# Patient Record
Sex: Male | Born: 1970
Health system: Southern US, Community
[De-identification: ages and names within clinical notes are randomized; demographics above are authoritative.]

---

## 2014-08-23 DIAGNOSIS — G43909 Migraine, unspecified, not intractable, without status migrainosus: Secondary | ICD-10-CM | POA: Insufficient documentation

## 2014-08-23 DIAGNOSIS — I10 Essential (primary) hypertension: Secondary | ICD-10-CM | POA: Insufficient documentation

## 2014-08-23 DIAGNOSIS — E790 Hyperuricemia without signs of inflammatory arthritis and tophaceous disease: Secondary | ICD-10-CM | POA: Insufficient documentation

## 2014-08-23 DIAGNOSIS — K219 Gastro-esophageal reflux disease without esophagitis: Secondary | ICD-10-CM | POA: Insufficient documentation

## 2016-08-16 DIAGNOSIS — E790 Hyperuricemia without signs of inflammatory arthritis and tophaceous disease: Secondary | ICD-10-CM | POA: Diagnosis not present

## 2016-08-16 DIAGNOSIS — I10 Essential (primary) hypertension: Secondary | ICD-10-CM | POA: Diagnosis not present

## 2016-08-16 DIAGNOSIS — Z79899 Other long term (current) drug therapy: Secondary | ICD-10-CM | POA: Diagnosis not present

## 2017-08-13 ENCOUNTER — Other Ambulatory Visit: Payer: Self-pay | Admitting: Surgery

## 2017-08-13 DIAGNOSIS — M25561 Pain in right knee: Secondary | ICD-10-CM

## 2017-08-13 DIAGNOSIS — M2351 Chronic instability of knee, right knee: Secondary | ICD-10-CM | POA: Diagnosis not present

## 2017-08-19 ENCOUNTER — Ambulatory Visit
Admission: RE | Admit: 2017-08-19 | Discharge: 2017-08-19 | Disposition: A | Discharge status: Home / Self Care | Payer: 59 | Source: Ambulatory Visit | Attending: Surgery | Admitting: Surgery

## 2017-08-19 DIAGNOSIS — M2351 Chronic instability of knee, right knee: Secondary | ICD-10-CM | POA: Insufficient documentation

## 2017-08-19 DIAGNOSIS — M25561 Pain in right knee: Secondary | ICD-10-CM | POA: Insufficient documentation

## 2017-08-26 DIAGNOSIS — E7800 Pure hypercholesterolemia, unspecified: Secondary | ICD-10-CM | POA: Insufficient documentation

## 2017-09-10 DIAGNOSIS — M25562 Pain in left knee: Secondary | ICD-10-CM | POA: Diagnosis not present

## 2017-09-10 DIAGNOSIS — M25561 Pain in right knee: Secondary | ICD-10-CM | POA: Diagnosis not present

## 2017-11-25 DIAGNOSIS — R7302 Impaired glucose tolerance (oral): Secondary | ICD-10-CM | POA: Insufficient documentation

## 2018-03-26 DIAGNOSIS — R7301 Impaired fasting glucose: Secondary | ICD-10-CM | POA: Diagnosis not present

## 2018-03-26 DIAGNOSIS — E790 Hyperuricemia without signs of inflammatory arthritis and tophaceous disease: Secondary | ICD-10-CM | POA: Diagnosis not present

## 2018-03-26 DIAGNOSIS — I10 Essential (primary) hypertension: Secondary | ICD-10-CM | POA: Diagnosis not present

## 2018-03-26 DIAGNOSIS — E7800 Pure hypercholesterolemia, unspecified: Secondary | ICD-10-CM | POA: Diagnosis not present

## 2018-03-26 DIAGNOSIS — Z79899 Other long term (current) drug therapy: Secondary | ICD-10-CM | POA: Diagnosis not present

## 2018-04-07 DIAGNOSIS — Z79899 Other long term (current) drug therapy: Secondary | ICD-10-CM | POA: Diagnosis not present

## 2018-04-07 DIAGNOSIS — Z0000 Encounter for general adult medical examination without abnormal findings: Secondary | ICD-10-CM | POA: Diagnosis not present

## 2018-04-07 DIAGNOSIS — I10 Essential (primary) hypertension: Secondary | ICD-10-CM | POA: Diagnosis not present

## 2018-04-07 DIAGNOSIS — E790 Hyperuricemia without signs of inflammatory arthritis and tophaceous disease: Secondary | ICD-10-CM | POA: Diagnosis not present

## 2018-09-16 DIAGNOSIS — E7800 Pure hypercholesterolemia, unspecified: Secondary | ICD-10-CM | POA: Diagnosis not present

## 2018-09-16 DIAGNOSIS — R7301 Impaired fasting glucose: Secondary | ICD-10-CM | POA: Diagnosis not present

## 2018-10-06 DIAGNOSIS — M2351 Chronic instability of knee, right knee: Secondary | ICD-10-CM | POA: Diagnosis not present

## 2019-01-14 IMAGING — MR MR KNEE*R* W/O CM
6 series · 37 of 40 positions shown · non-contrast
Comparison: None.

CLINICAL DATA: Prior ACL tear 4 years ago. Instability. Knee pain.

EXAM:
MRI OF THE RIGHT KNEE WITHOUT CONTRAST
TECHNIQUE: Multiplanar, multisequence MR imaging of the knee was performed. No
intravenous contrast was administered.

[Series 3: PD fat-sat · axial · 3.0mm · 0.56mm/px · z∈[-85,+27]mm · 8 of 35 slices shown (1 of 4)]
[im 1/35]
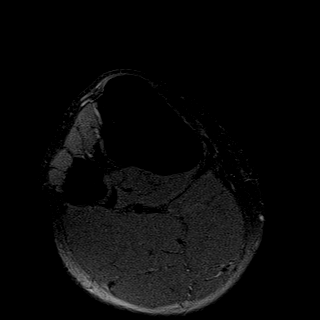
[im 5/35]
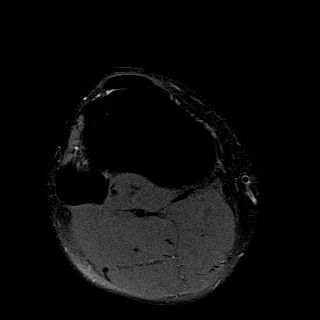
[im 10/35]
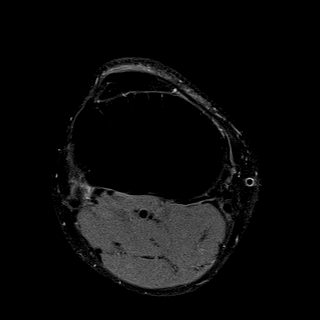
[im 15/35]
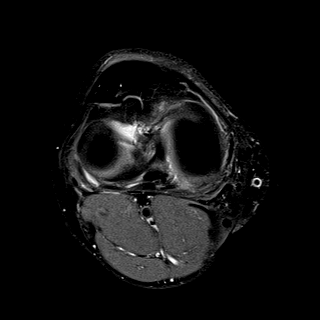
[im 20/35]
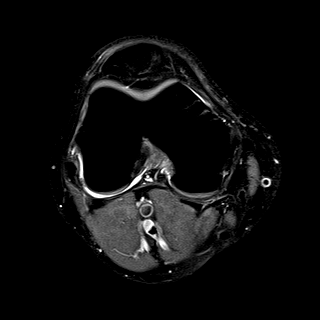
[im 25/35]
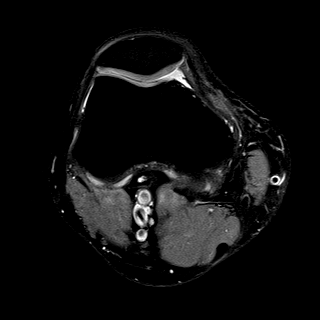
[im 30/35]
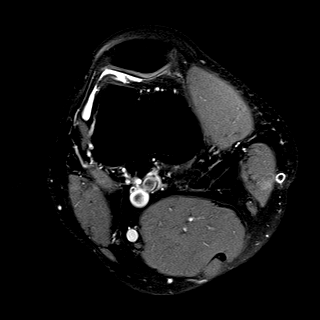
[im 35/35]
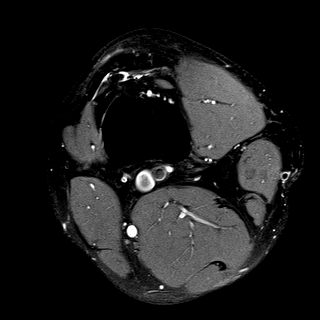

[Series 4: T1 · coronal · 3.0mm · 0.50mm/px · 4 of 33 slices shown]
[im 1/33]
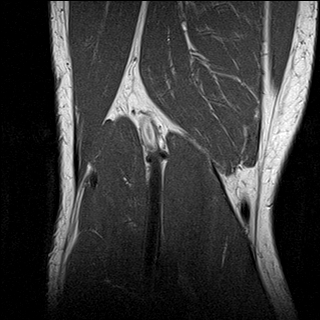
[im 6/33]
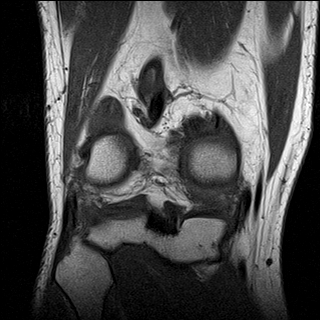
[im 11/33]
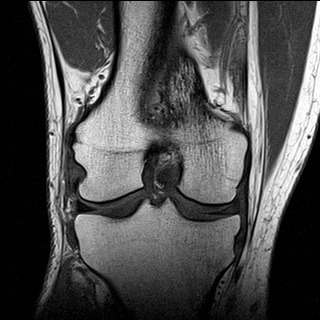
[im 17/33]
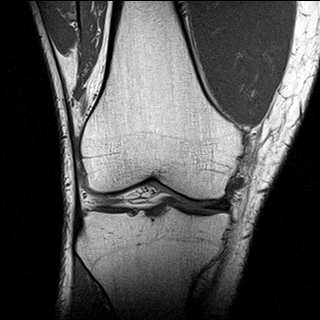

[Series 5: PD fat-sat · sagittal · 3.0mm · 0.50mm/px · 7 of 35 slices shown (2 of 4)]
[im 1/35]
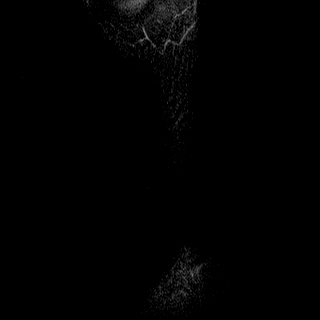
[im 6/35]
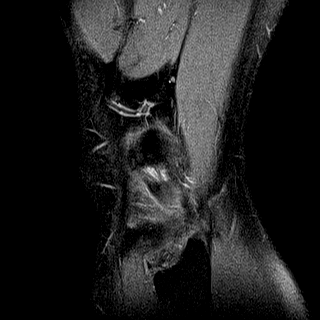
[im 12/35]
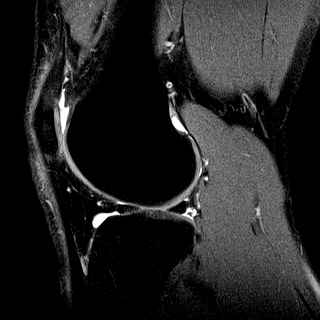
[im 18/35]
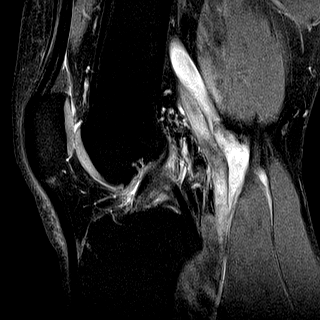
[im 23/35]
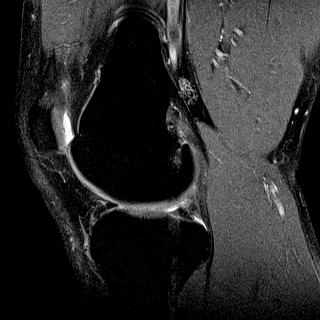
[im 29/35]
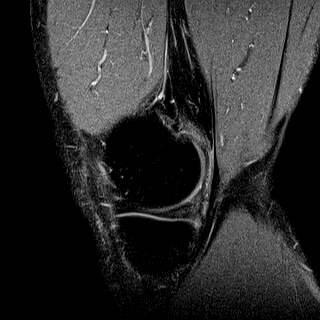
[im 35/35]
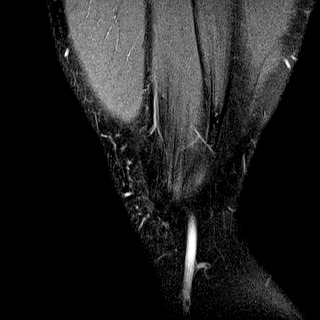

[Series 6: T2 fat-sat · coronal · 3.0mm · 0.31mm/px · 7 of 33 slices shown]
[im 1/33]
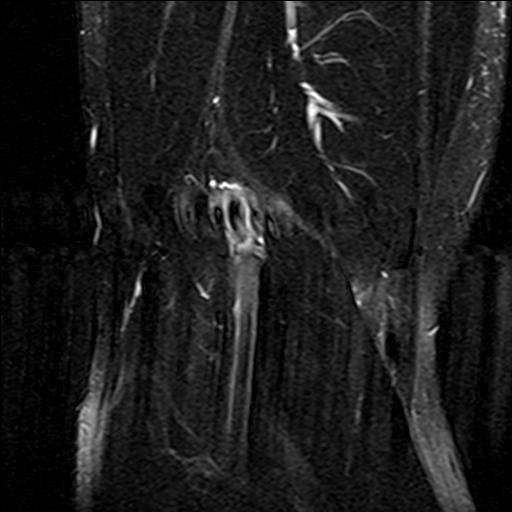
[im 6/33]
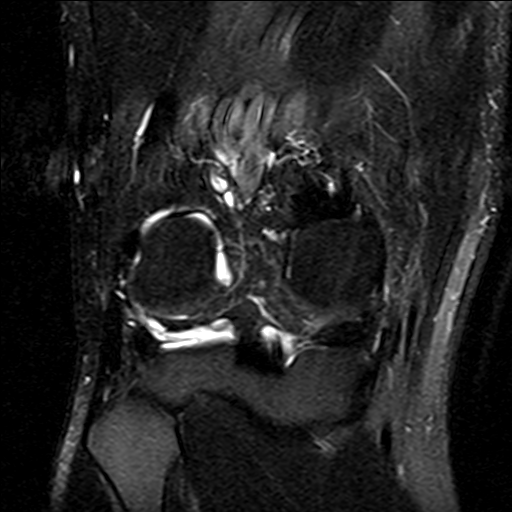
[im 11/33]
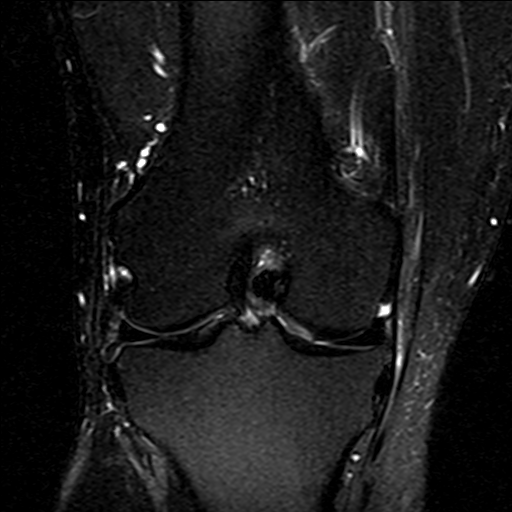
[im 17/33]
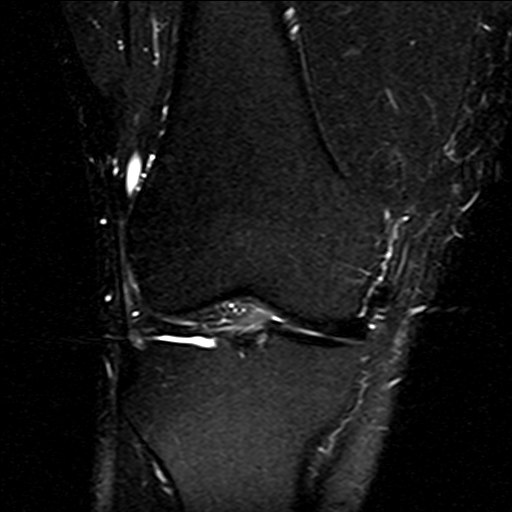
[im 22/33]
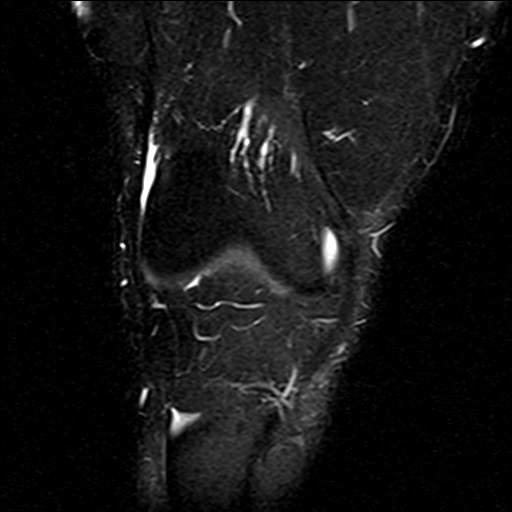
[im 27/33]
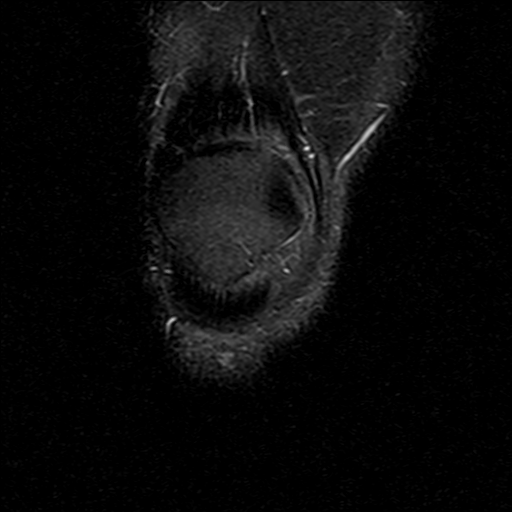
[im 33/33]
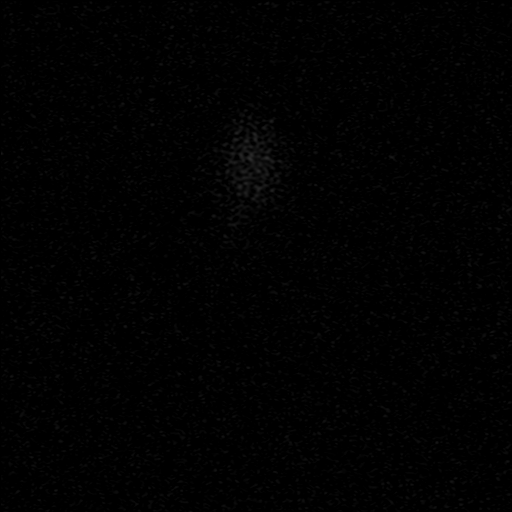

[Series 7: PD fat-sat · coronal · 3.0mm · 0.50mm/px · 7 of 33 slices shown (3 of 4)]
[im 1/33]
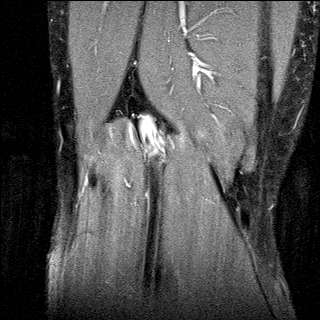
[im 6/33]
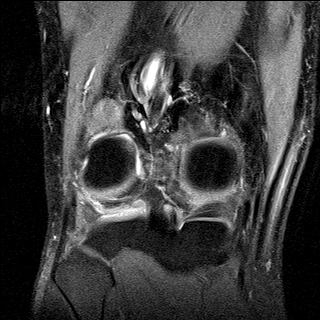
[im 11/33]
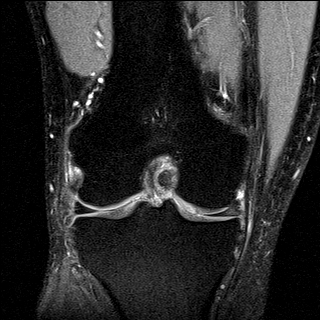
[im 17/33]
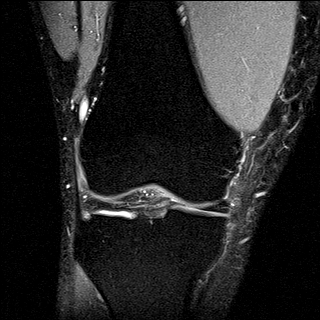
[im 22/33]
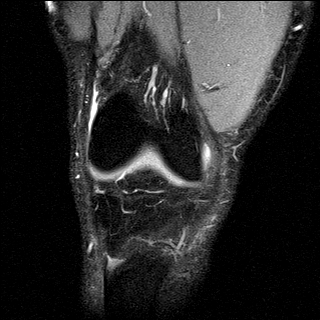
[im 27/33]
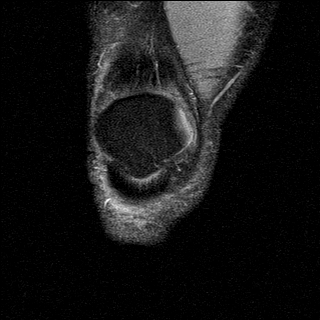
[im 33/33]
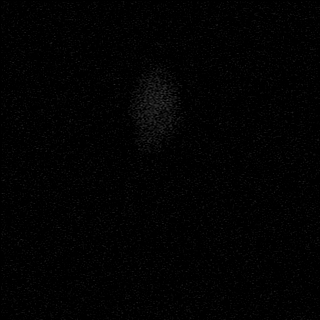

[Series 8: PD fat-sat · coronal · 2.0mm · 0.62mm/px · 4 of 21 slices shown (4 of 4)]
[im 1/21]
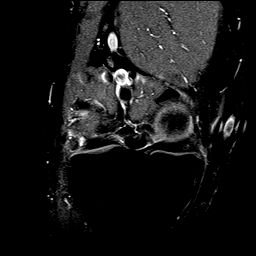
[im 7/21]
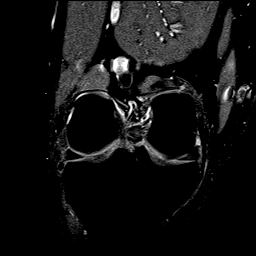
[im 14/21]
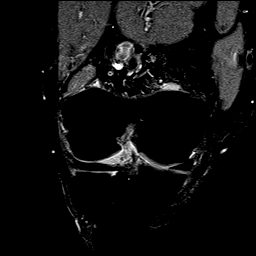
[im 21/21]
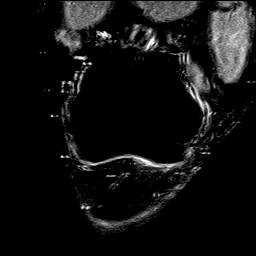

[37 of 40 positions shown; findings below may reference images not displayed]

FINDINGS: MENISCI

Medial meniscus:  Intact.

Lateral meniscus:  Intact.

LIGAMENTS

Cruciates:  Intact ACL and PCL.

Collaterals: Medial collateral ligament is intact. Lateral
collateral ligament complex is intact.

CARTILAGE

Patellofemoral:  No chondral defect.

Medial: Mild cartilage fissuring along the lateral aspect of the
medial femoral condyles.

Lateral:  No chondral defect.

Joint:  No joint effusion. Normal Hoffa's fat. No plical thickening.

Popliteal Fossa:  No Baker cyst. Intact popliteus tendon.

Extensor Mechanism: Intact quadriceps tendon and patellar tendon.
Intact medial and lateral patellar retinaculum. Intact MPFL.

Bones: No focal marrow signal abnormality. No fracture or
dislocation.

Other: No fluid collection or hematoma.
IMPRESSION: 1. No meniscal ligamentous injury of the right knee.
2. Mild cartilage fissuring along the lateral aspect of the medial
femoral condyles.

## 2019-03-10 MED FILL — LOSARTAN POTASSIUM 100 MG T: 100 | 30 days supply | Qty: 30 | Fill #0

## 2019-04-27 DIAGNOSIS — Z79899 Other long term (current) drug therapy: Secondary | ICD-10-CM | POA: Diagnosis not present

## 2019-04-27 DIAGNOSIS — R7302 Impaired glucose tolerance (oral): Secondary | ICD-10-CM | POA: Diagnosis not present

## 2019-04-27 DIAGNOSIS — R7989 Other specified abnormal findings of blood chemistry: Secondary | ICD-10-CM | POA: Diagnosis not present

## 2019-04-27 DIAGNOSIS — I10 Essential (primary) hypertension: Secondary | ICD-10-CM | POA: Diagnosis not present

## 2019-11-05 DIAGNOSIS — Z9189 Other specified personal risk factors, not elsewhere classified: Secondary | ICD-10-CM | POA: Diagnosis not present

## 2019-11-05 DIAGNOSIS — Z03818 Encounter for observation for suspected exposure to other biological agents ruled out: Secondary | ICD-10-CM | POA: Diagnosis not present

## 2020-09-05 ENCOUNTER — Other Ambulatory Visit: Payer: Self-pay | Admitting: Internal Medicine

## 2020-09-08 DIAGNOSIS — E7800 Pure hypercholesterolemia, unspecified: Secondary | ICD-10-CM | POA: Diagnosis not present

## 2020-09-08 DIAGNOSIS — R7989 Other specified abnormal findings of blood chemistry: Secondary | ICD-10-CM | POA: Diagnosis not present

## 2020-09-08 DIAGNOSIS — Z79899 Other long term (current) drug therapy: Secondary | ICD-10-CM | POA: Diagnosis not present

## 2020-09-08 DIAGNOSIS — R7302 Impaired glucose tolerance (oral): Secondary | ICD-10-CM | POA: Diagnosis not present

## 2020-09-08 DIAGNOSIS — E790 Hyperuricemia without signs of inflammatory arthritis and tophaceous disease: Secondary | ICD-10-CM | POA: Diagnosis not present

## 2020-09-08 DIAGNOSIS — Z125 Encounter for screening for malignant neoplasm of prostate: Secondary | ICD-10-CM | POA: Diagnosis not present

## 2020-11-03 DIAGNOSIS — Z23 Encounter for immunization: Secondary | ICD-10-CM | POA: Diagnosis not present

## 2021-03-20 ENCOUNTER — Other Ambulatory Visit: Payer: Self-pay

## 2021-03-20 MED FILL — Losartan Potassium Tab 100 MG: ORAL | 90 days supply | Qty: 90 | Fill #0 | Status: AC

## 2021-05-24 DIAGNOSIS — R059 Cough, unspecified: Secondary | ICD-10-CM | POA: Diagnosis not present

## 2021-05-24 DIAGNOSIS — R5383 Other fatigue: Secondary | ICD-10-CM | POA: Diagnosis not present

## 2021-05-24 DIAGNOSIS — U071 COVID-19: Secondary | ICD-10-CM | POA: Diagnosis not present

## 2021-05-24 DIAGNOSIS — R509 Fever, unspecified: Secondary | ICD-10-CM | POA: Diagnosis not present

## 2021-06-07 ENCOUNTER — Other Ambulatory Visit: Payer: Self-pay

## 2021-06-07 MED FILL — Losartan Potassium Tab 100 MG: ORAL | 90 days supply | Qty: 90 | Fill #1 | Status: AC

## 2021-07-13 ENCOUNTER — Other Ambulatory Visit: Payer: Self-pay

## 2021-07-13 DIAGNOSIS — I10 Essential (primary) hypertension: Secondary | ICD-10-CM | POA: Diagnosis not present

## 2021-07-13 DIAGNOSIS — I350 Nonrheumatic aortic (valve) stenosis: Secondary | ICD-10-CM | POA: Diagnosis not present

## 2021-07-13 MED ORDER — ROSUVASTATIN CALCIUM 5 MG PO TABS
ORAL_TABLET | ORAL | 11 refills | Status: AC
  Filled 2021-07-13: qty 30, 30d supply, fill #0
  Filled 2021-08-10: qty 30, 30d supply, fill #1
  Filled 2021-09-09: qty 30, 30d supply, fill #2

## 2021-08-10 ENCOUNTER — Other Ambulatory Visit: Payer: Self-pay

## 2021-08-21 DIAGNOSIS — R079 Chest pain, unspecified: Secondary | ICD-10-CM | POA: Diagnosis not present

## 2021-08-24 ENCOUNTER — Other Ambulatory Visit: Payer: Self-pay

## 2021-08-27 ENCOUNTER — Other Ambulatory Visit: Payer: Self-pay

## 2021-08-27 MED ORDER — LOSARTAN POTASSIUM 100 MG PO TABS
100.0000 mg | ORAL_TABLET | Freq: Every day | ORAL | 3 refills | Status: AC
  Filled 2021-08-27: qty 90, 90d supply, fill #0
  Filled 2022-01-09: qty 90, 90d supply, fill #1
  Filled 2022-03-28: qty 90, 90d supply, fill #2

## 2021-09-10 ENCOUNTER — Other Ambulatory Visit: Payer: Self-pay

## 2021-09-19 ENCOUNTER — Other Ambulatory Visit: Payer: Self-pay

## 2021-09-19 MED ORDER — COLCHICINE 0.6 MG PO TABS
ORAL_TABLET | ORAL | 1 refills | Status: AC
  Filled 2021-09-19: qty 10, 4d supply, fill #0

## 2021-09-25 DIAGNOSIS — E7800 Pure hypercholesterolemia, unspecified: Secondary | ICD-10-CM | POA: Diagnosis not present

## 2021-09-25 DIAGNOSIS — Z0000 Encounter for general adult medical examination without abnormal findings: Secondary | ICD-10-CM | POA: Diagnosis not present

## 2021-09-25 DIAGNOSIS — I10 Essential (primary) hypertension: Secondary | ICD-10-CM | POA: Diagnosis not present

## 2021-09-25 DIAGNOSIS — Z23 Encounter for immunization: Secondary | ICD-10-CM | POA: Diagnosis not present

## 2021-09-25 DIAGNOSIS — N1831 Chronic kidney disease, stage 3a: Secondary | ICD-10-CM | POA: Diagnosis not present

## 2021-09-25 DIAGNOSIS — N183 Chronic kidney disease, stage 3 unspecified: Secondary | ICD-10-CM | POA: Insufficient documentation

## 2021-10-05 ENCOUNTER — Other Ambulatory Visit: Payer: Self-pay

## 2021-10-05 MED ORDER — CEFDINIR 300 MG PO CAPS
ORAL_CAPSULE | ORAL | 0 refills | Status: AC
  Filled 2021-10-05: qty 20, 10d supply, fill #0

## 2021-10-05 MED ORDER — ROSUVASTATIN CALCIUM 5 MG PO TABS
ORAL_TABLET | ORAL | 3 refills | Status: AC
  Filled 2021-10-05: qty 90, 90d supply, fill #0
  Filled 2022-01-09: qty 90, 90d supply, fill #1
  Filled 2022-03-28: qty 90, 90d supply, fill #2

## 2021-11-12 DIAGNOSIS — E7800 Pure hypercholesterolemia, unspecified: Secondary | ICD-10-CM | POA: Diagnosis not present

## 2021-11-12 DIAGNOSIS — I10 Essential (primary) hypertension: Secondary | ICD-10-CM | POA: Diagnosis not present

## 2021-11-12 DIAGNOSIS — E790 Hyperuricemia without signs of inflammatory arthritis and tophaceous disease: Secondary | ICD-10-CM | POA: Diagnosis not present

## 2021-11-12 DIAGNOSIS — Z79899 Other long term (current) drug therapy: Secondary | ICD-10-CM | POA: Diagnosis not present

## 2021-11-12 DIAGNOSIS — N1831 Chronic kidney disease, stage 3a: Secondary | ICD-10-CM | POA: Diagnosis not present

## 2021-11-12 DIAGNOSIS — R7302 Impaired glucose tolerance (oral): Secondary | ICD-10-CM | POA: Diagnosis not present

## 2021-11-12 DIAGNOSIS — K219 Gastro-esophageal reflux disease without esophagitis: Secondary | ICD-10-CM | POA: Diagnosis not present

## 2021-11-13 ENCOUNTER — Ambulatory Visit: Payer: 59 | Admitting: Registered Nurse

## 2021-11-13 ENCOUNTER — Encounter
Admission: RE | Disposition: A | Discharge status: Home / Self Care | Payer: Self-pay | Source: Home / Self Care | Attending: Gastroenterology

## 2021-11-13 ENCOUNTER — Ambulatory Visit
Admission: RE | Admit: 2021-11-13 | Discharge: 2021-11-13 | Disposition: A | Discharge status: Home / Self Care | Payer: 59 | Attending: Gastroenterology | Admitting: Gastroenterology

## 2021-11-13 ENCOUNTER — Encounter: Payer: Self-pay | Admitting: Gastroenterology

## 2021-11-13 DIAGNOSIS — Z87891 Personal history of nicotine dependence: Secondary | ICD-10-CM | POA: Insufficient documentation

## 2021-11-13 DIAGNOSIS — D124 Benign neoplasm of descending colon: Secondary | ICD-10-CM | POA: Diagnosis not present

## 2021-11-13 DIAGNOSIS — Z79899 Other long term (current) drug therapy: Secondary | ICD-10-CM | POA: Diagnosis not present

## 2021-11-13 DIAGNOSIS — K5730 Diverticulosis of large intestine without perforation or abscess without bleeding: Secondary | ICD-10-CM | POA: Diagnosis not present

## 2021-11-13 DIAGNOSIS — D122 Benign neoplasm of ascending colon: Secondary | ICD-10-CM | POA: Insufficient documentation

## 2021-11-13 DIAGNOSIS — I10 Essential (primary) hypertension: Secondary | ICD-10-CM | POA: Diagnosis not present

## 2021-11-13 DIAGNOSIS — Z1211 Encounter for screening for malignant neoplasm of colon: Secondary | ICD-10-CM | POA: Insufficient documentation

## 2021-11-13 DIAGNOSIS — K635 Polyp of colon: Secondary | ICD-10-CM | POA: Diagnosis not present

## 2021-11-13 HISTORY — PX: COLONOSCOPY: SHX5424

## 2021-11-13 SURGERY — COLONOSCOPY
Anesthesia: General

## 2021-11-13 MED ORDER — SODIUM CHLORIDE 0.9 % IV SOLN: INTRAVENOUS | Status: DC

## 2021-11-13 MED ORDER — PROPOFOL 10 MG/ML IV BOLUS
INTRAVENOUS | Status: DC | PRN
  Administered 2021-11-13: 70 mg via INTRAVENOUS

## 2021-11-13 MED ORDER — LIDOCAINE HCL (CARDIAC) PF 100 MG/5ML IV SOSY
PREFILLED_SYRINGE | INTRAVENOUS | Status: DC | PRN
  Administered 2021-11-13: 60 mg via INTRAVENOUS

## 2021-11-13 MED ORDER — PROPOFOL 500 MG/50ML IV EMUL
INTRAVENOUS | Status: DC | PRN
  Administered 2021-11-13: 140 ug/kg/min via INTRAVENOUS

## 2021-11-13 NOTE — Op Note (Signed)
[facility] Gastroenterology Patient Name: Colton Williams Procedure Date: 11/13/2021 7:42 AM MRN: [id number] Account #: [id number] Date of Birth: 1971-04-18 Admit Type: Outpatient Age: 50 Room: [facility] ENDO ROOM 4 Gender: Male Note Status: Finalized Instrument Name: Jasper Riling [id number] Procedure:             Colonoscopy Indications:           Screening for colorectal malignant neoplasm Providers:             Lucilla Lame MD, MD Referring MD:          Juluis Rainier (Referring MD) Medicines:             Propofol per Anesthesia Complications:         No immediate complications. Procedure:             Pre-Anesthesia Assessment:                        - Prior to the procedure, a History and Physical was                         performed, and patient medications and allergies were                         reviewed. The patient's tolerance of previous                         anesthesia was also reviewed. The risks and benefits                         of the procedure and the sedation options and risks                         were discussed with the patient. All questions were                         answered, and informed consent was obtained. Prior                         Anticoagulants: The patient has taken no previous                         anticoagulant or antiplatelet agents. ASA Grade                         Assessment: II - A patient with mild systemic disease.                         After reviewing the risks and benefits, the patient                         was deemed in satisfactory condition to undergo the                         procedure.                        After obtaining informed consent, the colonoscope was  passed under direct vision. Throughout the procedure,                         the patient's blood pressure, pulse, and oxygen                         saturations were monitored continuously. The                          Colonoscope was introduced through the anus and                         advanced to the the cecum, identified by appendiceal                         orifice and ileocecal valve. The colonoscopy was                         performed without difficulty. The patient tolerated                         the procedure well. The quality of the bowel                         preparation was excellent. Findings:      The perianal and digital rectal examinations were normal.      A 6 mm polyp was found in the ascending colon. The polyp was sessile.       The polyp was removed with a cold snare. Resection and retrieval were       complete.      A 3 mm polyp was found in the descending colon. The polyp was sessile.       The polyp was removed with a cold snare. Resection and retrieval were       complete.      Multiple small-mouthed diverticula were found in the sigmoid colon. Impression:            - One 6 mm polyp in the ascending colon, removed with                         a cold snare. Resected and retrieved.                        - One 3 mm polyp in the descending colon, removed with                         a cold snare. Resected and retrieved.                        - Diverticulosis in the sigmoid colon. Recommendation:        - Discharge patient to home.                        - Resume previous diet.                        - Continue present medications.                        -  Await pathology results.                        - If the pathology report reveals adenomatous tissue,                         then repeat the colonoscopy for surveillance in 7                         years. Procedure Code(s):     --- Professional ---                        [id number], Colonoscopy, flexible; with removal of                         tumor(s), polyp(s), or other lesion(s) by snare                         technique Diagnosis Code(s):     --- Professional ---                        Z12.11, Encounter for  screening for malignant neoplasm                         of colon                        K63.5, Polyp of colon CPT copyright 2019 [employer]. All rights reserved. The codes documented in this report are preliminary and upon coder review may  be revised to meet current compliance requirements. Lucilla Lame MD, MD 11/13/2021 8:04:35 AM This report has been signed electronically. Number of Addenda: 0 Note Initiated On: 11/13/2021 7:42 AM Scope Withdrawal Time: 0 hours 13 minutes 6 seconds  Total Procedure Duration: 0 hours 15 minutes 50 seconds  Estimated Blood Loss:  Estimated blood loss: none.      [facility]

## 2021-11-13 NOTE — H&P (Signed)
° °  Lucilla Lame, MD [facility] [address], [place], [place] [address] Phone: [phone] Fax : [phone]  Primary Care Physician:  Sallee Lange, NP Primary Gastroenterologist:  Dr. Allen Norris  Pre-Procedure History & Physical: HPI:  Colton Williams is a 50 y.o. male is here for a screening colonoscopy.   Past Medical History:  Diagnosis Date   Hypertension     History reviewed. No pertinent surgical history.  Prior to Admission medications   Medication Sig Start Date End Date Taking? Authorizing Provider  famotidine (PEPCID) 20 MG tablet Take 20 mg by mouth 2 (two) times daily.   Yes [provider]  losartan (COZAAR) 100 MG tablet TAKE 1 TABLET BY MOUTH ONCE DAILY FOR BLOOD PRESSURE 08/27/21  Yes   rosuvastatin (CRESTOR) 5 MG tablet Take 1 tablet (5 mg total) by mouth once daily 07/13/21  Yes   cefdinir (OMNICEF) 300 MG capsule Take 1 capsule (300 mg total) by mouth every 12 (twelve) hours for 10 days Patient not taking: Reported on 11/13/2021 10/05/21     colchicine 0.6 MG tablet Take 2 tablets (1.2mg ) by mouth at first sign of gout flare followed by 1 tablet (0.6mg ) after 1 hour. (Max 1.8mg  within 1 hour) 09/19/21     rosuvastatin (CRESTOR) 5 MG tablet Take 1 tablet (5 mg total) by mouth once daily 10/05/21       Allergies as of 10/15/2021   (Not on File)    History reviewed. No pertinent family history.  Social History   Socioeconomic History   Marital status: Married    Spouse name: Not on file   Number of children: Not on file   Years of education: Not on file   Highest education level: Not on file  Occupational History   Not on file  Tobacco Use   Smoking status: Former    Types: Cigarettes   Smokeless tobacco: Never  Vaping Use   Vaping Use: Never used  Substance and Sexual Activity   Alcohol use: Not Currently   Drug use: Not Currently   Sexual activity: Not on file  Other Topics Concern   Not on file  Social History Narrative    Not on file   Social Determinants of Health   Financial Resource Strain: Not on file  Food Insecurity: Not on file  Transportation Needs: Not on file  Physical Activity: Not on file  Stress: Not on file  Social Connections: Not on file  Intimate Partner Violence: Not on file    Review of Systems: See HPI, otherwise negative ROS  Physical Exam: BP (!) 149/87    Pulse 60    Temp (!) 96.1 F (35.6 C) (Temporal)    Resp 16    Ht 5\' 10"  (1.778 m)    Wt 96.2 kg    SpO2 99%    BMI 30.42 kg/m  General:   Alert,  pleasant and cooperative in NAD Head:  Normocephalic and atraumatic. Neck:  Supple; no masses or thyromegaly. Lungs:  Clear throughout to auscultation.    Heart:  Regular rate and rhythm. Abdomen:  Soft, nontender and nondistended. Normal bowel sounds, without guarding, and without rebound.   Neurologic:  Alert and  oriented x4;  grossly normal neurologically.  Impression/Plan: Colton Williams is now here to undergo a screening colonoscopy.  Risks, benefits, and alternatives regarding colonoscopy have been reviewed with the patient.  Questions have been answered.  All parties agreeable.

## 2021-11-13 NOTE — Transfer of Care (Signed)
Immediate Anesthesia Transfer of Care Note  Patient: Colton Williams  Procedure(s) Performed: COLONOSCOPY  Patient Location: PACU  Anesthesia Type:General  Level of Consciousness: awake, alert  and oriented  Airway & Oxygen Therapy: Patient Spontanous Breathing  Post-op Assessment: Report given to RN and Post -op Vital signs reviewed and stable  Post vital signs: Reviewed and stable  Last Vitals:  Vitals Value Taken Time  BP 109/72 11/13/21 0806  Temp    Pulse 65 11/13/21 0806  Resp 18 11/13/21 0806  SpO2 96 % 11/13/21 0806  Vitals shown include unvalidated device data.  Last Pain:  Vitals:   11/13/21 0714  TempSrc: Temporal         Complications: No notable events documented.

## 2021-11-13 NOTE — Anesthesia Postprocedure Evaluation (Signed)
Anesthesia Post Note  Patient: Colton Williams  Procedure(s) Performed: COLONOSCOPY  Patient location during evaluation: PACU Anesthesia Type: General Level of consciousness: awake and awake and alert Pain management: pain level controlled Vital Signs Assessment: post-procedure vital signs reviewed and stable Respiratory status: spontaneous breathing and respiratory function stable Cardiovascular status: blood pressure returned to baseline Anesthetic complications: no   No notable events documented.   Last Vitals:  Vitals:   11/13/21 0714 11/13/21 0806  BP: (!) 149/87   Pulse: 60   Resp: 16   Temp: (!) 35.6 C (!) 35.9 C  SpO2: 99%     Last Pain:  Vitals:   11/13/21 0816  TempSrc:   PainSc: 0-No pain                 VAN STAVEREN,Jumana Paccione

## 2021-11-13 NOTE — Anesthesia Preprocedure Evaluation (Signed)
Anesthesia Evaluation  Patient identified by MRN, date of birth, ID band Patient awake    Reviewed: Allergy & Precautions, NPO status , Patient's Chart, lab work & pertinent test results  Airway Mallampati: II  TM Distance: >3 FB Neck ROM: full    Dental  (+) Teeth Intact   Pulmonary neg pulmonary ROS, former smoker,    Pulmonary exam normal breath sounds clear to auscultation       Cardiovascular Exercise Tolerance: Good hypertension, Pt. on medications negative cardio ROS Normal cardiovascular exam Rhythm:Regular Rate:Normal     Neuro/Psych negative neurological ROS  negative psych ROS   GI/Hepatic negative GI ROS, Neg liver ROS,   Endo/Other  negative endocrine ROS  Renal/GU negative Renal ROS  negative genitourinary   Musculoskeletal negative musculoskeletal ROS (+)   Abdominal   Peds negative pediatric ROS (+)  Hematology negative hematology ROS (+)   Anesthesia Other Findings Past Medical History: No date: Hypertension  History reviewed. No pertinent surgical history.  BMI    Body Mass Index: 30.42 kg/m      Reproductive/Obstetrics negative OB ROS                             Anesthesia Physical Anesthesia Plan  ASA: 2  Anesthesia Plan: General   Post-op Pain Management:    Induction: Intravenous  PONV Risk Score and Plan: Propofol infusion and TIVA  Airway Management Planned: Natural Airway and Nasal Cannula  Additional Equipment:   Intra-op Plan:   Post-operative Plan:   Informed Consent: I have reviewed the patients History and Physical, chart, labs and discussed the procedure including the risks, benefits and alternatives for the proposed anesthesia with the patient or authorized representative who has indicated his/her understanding and acceptance.     Dental Advisory Given  Plan Discussed with: CRNA and Surgeon  Anesthesia Plan Comments:          Anesthesia Quick Evaluation

## 2021-11-14 ENCOUNTER — Encounter: Payer: Self-pay | Admitting: Gastroenterology

## 2021-11-14 LAB — SURGICAL PATHOLOGY

## 2022-01-09 ENCOUNTER — Other Ambulatory Visit: Payer: Self-pay

## 2022-01-18 DIAGNOSIS — I350 Nonrheumatic aortic (valve) stenosis: Secondary | ICD-10-CM | POA: Diagnosis not present

## 2022-02-22 DIAGNOSIS — I350 Nonrheumatic aortic (valve) stenosis: Secondary | ICD-10-CM | POA: Diagnosis not present

## 2022-02-22 DIAGNOSIS — Q231 Congenital insufficiency of aortic valve: Secondary | ICD-10-CM | POA: Diagnosis not present

## 2022-02-22 DIAGNOSIS — I77810 Thoracic aortic ectasia: Secondary | ICD-10-CM | POA: Diagnosis not present

## 2022-03-04 DIAGNOSIS — I10 Essential (primary) hypertension: Secondary | ICD-10-CM | POA: Diagnosis not present

## 2022-03-04 DIAGNOSIS — Q231 Congenital insufficiency of aortic valve: Secondary | ICD-10-CM | POA: Diagnosis not present

## 2022-03-04 DIAGNOSIS — R011 Cardiac murmur, unspecified: Secondary | ICD-10-CM | POA: Diagnosis not present

## 2022-03-04 DIAGNOSIS — I350 Nonrheumatic aortic (valve) stenosis: Secondary | ICD-10-CM | POA: Diagnosis not present

## 2022-03-21 DIAGNOSIS — Z131 Encounter for screening for diabetes mellitus: Secondary | ICD-10-CM | POA: Diagnosis not present

## 2022-03-21 DIAGNOSIS — E7800 Pure hypercholesterolemia, unspecified: Secondary | ICD-10-CM | POA: Diagnosis not present

## 2022-03-21 DIAGNOSIS — I10 Essential (primary) hypertension: Secondary | ICD-10-CM | POA: Diagnosis not present

## 2022-03-25 DIAGNOSIS — I350 Nonrheumatic aortic (valve) stenosis: Secondary | ICD-10-CM | POA: Diagnosis not present

## 2022-03-25 DIAGNOSIS — I129 Hypertensive chronic kidney disease with stage 1 through stage 4 chronic kidney disease, or unspecified chronic kidney disease: Secondary | ICD-10-CM | POA: Diagnosis not present

## 2022-03-25 DIAGNOSIS — N1831 Chronic kidney disease, stage 3a: Secondary | ICD-10-CM | POA: Diagnosis not present

## 2022-03-25 DIAGNOSIS — I77819 Aortic ectasia, unspecified site: Secondary | ICD-10-CM | POA: Diagnosis not present

## 2022-03-25 DIAGNOSIS — R0602 Shortness of breath: Secondary | ICD-10-CM | POA: Diagnosis not present

## 2022-03-25 DIAGNOSIS — R7303 Prediabetes: Secondary | ICD-10-CM | POA: Diagnosis not present

## 2022-03-25 DIAGNOSIS — Z87891 Personal history of nicotine dependence: Secondary | ICD-10-CM | POA: Diagnosis not present

## 2022-03-25 DIAGNOSIS — E785 Hyperlipidemia, unspecified: Secondary | ICD-10-CM | POA: Diagnosis not present

## 2022-03-28 ENCOUNTER — Other Ambulatory Visit: Payer: Self-pay

## 2022-05-27 DIAGNOSIS — I77810 Thoracic aortic ectasia: Secondary | ICD-10-CM | POA: Insufficient documentation

## 2022-05-27 DIAGNOSIS — I5032 Chronic diastolic (congestive) heart failure: Secondary | ICD-10-CM | POA: Insufficient documentation

## 2022-06-03 DIAGNOSIS — M109 Gout, unspecified: Secondary | ICD-10-CM | POA: Diagnosis not present

## 2022-06-03 DIAGNOSIS — I350 Nonrheumatic aortic (valve) stenosis: Secondary | ICD-10-CM | POA: Diagnosis not present

## 2022-06-03 DIAGNOSIS — Z01810 Encounter for preprocedural cardiovascular examination: Secondary | ICD-10-CM | POA: Diagnosis not present

## 2022-06-03 DIAGNOSIS — K219 Gastro-esophageal reflux disease without esophagitis: Secondary | ICD-10-CM | POA: Diagnosis not present

## 2022-06-03 DIAGNOSIS — I5032 Chronic diastolic (congestive) heart failure: Secondary | ICD-10-CM | POA: Diagnosis not present

## 2022-06-03 DIAGNOSIS — F419 Anxiety disorder, unspecified: Secondary | ICD-10-CM | POA: Diagnosis not present

## 2022-06-03 DIAGNOSIS — N183 Chronic kidney disease, stage 3 unspecified: Secondary | ICD-10-CM | POA: Diagnosis not present

## 2022-06-03 DIAGNOSIS — Q230 Congenital stenosis of aortic valve: Secondary | ICD-10-CM | POA: Diagnosis not present

## 2022-06-03 DIAGNOSIS — I2510 Atherosclerotic heart disease of native coronary artery without angina pectoris: Secondary | ICD-10-CM | POA: Diagnosis not present

## 2022-06-03 DIAGNOSIS — I130 Hypertensive heart and chronic kidney disease with heart failure and stage 1 through stage 4 chronic kidney disease, or unspecified chronic kidney disease: Secondary | ICD-10-CM | POA: Diagnosis not present

## 2022-06-03 DIAGNOSIS — N1831 Chronic kidney disease, stage 3a: Secondary | ICD-10-CM | POA: Diagnosis not present

## 2022-06-03 DIAGNOSIS — I77810 Thoracic aortic ectasia: Secondary | ICD-10-CM | POA: Diagnosis not present

## 2022-06-03 DIAGNOSIS — I959 Hypotension, unspecified: Secondary | ICD-10-CM | POA: Diagnosis not present

## 2022-06-03 DIAGNOSIS — E7800 Pure hypercholesterolemia, unspecified: Secondary | ICD-10-CM | POA: Diagnosis not present

## 2022-06-03 DIAGNOSIS — J9811 Atelectasis: Secondary | ICD-10-CM | POA: Diagnosis not present

## 2022-06-03 DIAGNOSIS — R7303 Prediabetes: Secondary | ICD-10-CM | POA: Diagnosis not present

## 2022-06-03 DIAGNOSIS — I110 Hypertensive heart disease with heart failure: Secondary | ICD-10-CM | POA: Diagnosis not present

## 2022-06-03 DIAGNOSIS — I492 Junctional premature depolarization: Secondary | ICD-10-CM | POA: Diagnosis not present

## 2022-06-03 DIAGNOSIS — E785 Hyperlipidemia, unspecified: Secondary | ICD-10-CM | POA: Diagnosis not present

## 2022-06-03 DIAGNOSIS — Z79899 Other long term (current) drug therapy: Secondary | ICD-10-CM | POA: Diagnosis not present

## 2022-06-03 DIAGNOSIS — I352 Nonrheumatic aortic (valve) stenosis with insufficiency: Secondary | ICD-10-CM | POA: Diagnosis not present

## 2022-06-03 DIAGNOSIS — N189 Chronic kidney disease, unspecified: Secondary | ICD-10-CM | POA: Diagnosis not present

## 2022-06-03 DIAGNOSIS — I5033 Acute on chronic diastolic (congestive) heart failure: Secondary | ICD-10-CM | POA: Diagnosis not present

## 2022-06-03 DIAGNOSIS — Z87891 Personal history of nicotine dependence: Secondary | ICD-10-CM | POA: Diagnosis not present

## 2022-06-03 DIAGNOSIS — R7302 Impaired glucose tolerance (oral): Secondary | ICD-10-CM | POA: Diagnosis not present

## 2022-06-03 DIAGNOSIS — I358 Other nonrheumatic aortic valve disorders: Secondary | ICD-10-CM | POA: Diagnosis not present

## 2022-06-03 DIAGNOSIS — Z4682 Encounter for fitting and adjustment of non-vascular catheter: Secondary | ICD-10-CM | POA: Diagnosis not present

## 2022-06-03 DIAGNOSIS — Q231 Congenital insufficiency of aortic valve: Secondary | ICD-10-CM | POA: Diagnosis not present

## 2022-06-03 DIAGNOSIS — I7121 Aneurysm of the ascending aorta, without rupture: Secondary | ICD-10-CM | POA: Insufficient documentation

## 2022-06-04 DIAGNOSIS — J9811 Atelectasis: Secondary | ICD-10-CM | POA: Diagnosis not present

## 2022-06-04 DIAGNOSIS — F419 Anxiety disorder, unspecified: Secondary | ICD-10-CM | POA: Diagnosis not present

## 2022-06-04 DIAGNOSIS — Q231 Congenital insufficiency of aortic valve: Secondary | ICD-10-CM | POA: Diagnosis not present

## 2022-06-04 DIAGNOSIS — Q230 Congenital stenosis of aortic valve: Secondary | ICD-10-CM | POA: Diagnosis not present

## 2022-06-04 DIAGNOSIS — Z4682 Encounter for fitting and adjustment of non-vascular catheter: Secondary | ICD-10-CM | POA: Diagnosis not present

## 2022-06-04 DIAGNOSIS — K219 Gastro-esophageal reflux disease without esophagitis: Secondary | ICD-10-CM | POA: Diagnosis not present

## 2022-06-04 DIAGNOSIS — N183 Chronic kidney disease, stage 3 unspecified: Secondary | ICD-10-CM | POA: Diagnosis not present

## 2022-06-04 DIAGNOSIS — G8918 Other acute postprocedural pain: Secondary | ICD-10-CM | POA: Insufficient documentation

## 2022-06-04 DIAGNOSIS — I358 Other nonrheumatic aortic valve disorders: Secondary | ICD-10-CM | POA: Diagnosis not present

## 2022-06-04 DIAGNOSIS — I352 Nonrheumatic aortic (valve) stenosis with insufficiency: Secondary | ICD-10-CM | POA: Diagnosis not present

## 2022-06-04 DIAGNOSIS — I110 Hypertensive heart disease with heart failure: Secondary | ICD-10-CM | POA: Diagnosis not present

## 2022-06-04 DIAGNOSIS — Z952 Presence of prosthetic heart valve: Secondary | ICD-10-CM | POA: Insufficient documentation

## 2022-06-04 DIAGNOSIS — I5033 Acute on chronic diastolic (congestive) heart failure: Secondary | ICD-10-CM | POA: Diagnosis not present

## 2022-06-05 DIAGNOSIS — E7800 Pure hypercholesterolemia, unspecified: Secondary | ICD-10-CM | POA: Diagnosis not present

## 2022-06-05 DIAGNOSIS — I5032 Chronic diastolic (congestive) heart failure: Secondary | ICD-10-CM | POA: Diagnosis not present

## 2022-06-05 DIAGNOSIS — N183 Chronic kidney disease, stage 3 unspecified: Secondary | ICD-10-CM | POA: Diagnosis not present

## 2022-06-05 DIAGNOSIS — N1831 Chronic kidney disease, stage 3a: Secondary | ICD-10-CM | POA: Diagnosis not present

## 2022-06-05 DIAGNOSIS — J948 Other specified pleural conditions: Secondary | ICD-10-CM | POA: Diagnosis not present

## 2022-06-05 DIAGNOSIS — R918 Other nonspecific abnormal finding of lung field: Secondary | ICD-10-CM | POA: Diagnosis not present

## 2022-06-05 DIAGNOSIS — I77810 Thoracic aortic ectasia: Secondary | ICD-10-CM | POA: Diagnosis not present

## 2022-06-05 DIAGNOSIS — I2510 Atherosclerotic heart disease of native coronary artery without angina pectoris: Secondary | ICD-10-CM | POA: Diagnosis not present

## 2022-06-05 DIAGNOSIS — J90 Pleural effusion, not elsewhere classified: Secondary | ICD-10-CM | POA: Diagnosis not present

## 2022-06-05 DIAGNOSIS — M109 Gout, unspecified: Secondary | ICD-10-CM | POA: Diagnosis not present

## 2022-06-05 DIAGNOSIS — I350 Nonrheumatic aortic (valve) stenosis: Secondary | ICD-10-CM | POA: Diagnosis not present

## 2022-06-05 DIAGNOSIS — I352 Nonrheumatic aortic (valve) stenosis with insufficiency: Secondary | ICD-10-CM | POA: Diagnosis not present

## 2022-06-05 DIAGNOSIS — I130 Hypertensive heart and chronic kidney disease with heart failure and stage 1 through stage 4 chronic kidney disease, or unspecified chronic kidney disease: Secondary | ICD-10-CM | POA: Diagnosis not present

## 2022-06-05 DIAGNOSIS — Z79899 Other long term (current) drug therapy: Secondary | ICD-10-CM | POA: Diagnosis not present

## 2022-06-05 DIAGNOSIS — Z952 Presence of prosthetic heart valve: Secondary | ICD-10-CM | POA: Diagnosis not present

## 2022-06-05 DIAGNOSIS — R0989 Other specified symptoms and signs involving the circulatory and respiratory systems: Secondary | ICD-10-CM | POA: Diagnosis not present

## 2022-06-05 DIAGNOSIS — I492 Junctional premature depolarization: Secondary | ICD-10-CM | POA: Diagnosis not present

## 2022-06-05 DIAGNOSIS — I959 Hypotension, unspecified: Secondary | ICD-10-CM | POA: Diagnosis not present

## 2022-06-05 DIAGNOSIS — R7302 Impaired glucose tolerance (oral): Secondary | ICD-10-CM | POA: Diagnosis not present

## 2022-06-06 DIAGNOSIS — Z952 Presence of prosthetic heart valve: Secondary | ICD-10-CM | POA: Diagnosis not present

## 2022-06-06 DIAGNOSIS — I352 Nonrheumatic aortic (valve) stenosis with insufficiency: Secondary | ICD-10-CM | POA: Diagnosis not present

## 2022-06-06 DIAGNOSIS — R918 Other nonspecific abnormal finding of lung field: Secondary | ICD-10-CM | POA: Diagnosis not present

## 2022-06-07 DIAGNOSIS — J948 Other specified pleural conditions: Secondary | ICD-10-CM | POA: Diagnosis not present

## 2022-06-07 DIAGNOSIS — I352 Nonrheumatic aortic (valve) stenosis with insufficiency: Secondary | ICD-10-CM | POA: Diagnosis not present

## 2022-06-07 DIAGNOSIS — I5032 Chronic diastolic (congestive) heart failure: Secondary | ICD-10-CM | POA: Diagnosis not present

## 2022-06-07 DIAGNOSIS — I77810 Thoracic aortic ectasia: Secondary | ICD-10-CM | POA: Diagnosis not present

## 2022-06-07 DIAGNOSIS — R918 Other nonspecific abnormal finding of lung field: Secondary | ICD-10-CM | POA: Diagnosis not present

## 2022-06-07 DIAGNOSIS — J90 Pleural effusion, not elsewhere classified: Secondary | ICD-10-CM | POA: Diagnosis not present

## 2022-06-08 DIAGNOSIS — R7302 Impaired glucose tolerance (oral): Secondary | ICD-10-CM | POA: Diagnosis not present

## 2022-06-08 DIAGNOSIS — N1831 Chronic kidney disease, stage 3a: Secondary | ICD-10-CM | POA: Diagnosis not present

## 2022-06-08 DIAGNOSIS — E7800 Pure hypercholesterolemia, unspecified: Secondary | ICD-10-CM | POA: Diagnosis not present

## 2022-06-08 DIAGNOSIS — I352 Nonrheumatic aortic (valve) stenosis with insufficiency: Secondary | ICD-10-CM | POA: Diagnosis not present

## 2022-06-08 DIAGNOSIS — I5032 Chronic diastolic (congestive) heart failure: Secondary | ICD-10-CM | POA: Diagnosis not present

## 2022-06-08 DIAGNOSIS — I350 Nonrheumatic aortic (valve) stenosis: Secondary | ICD-10-CM | POA: Diagnosis not present

## 2022-06-08 DIAGNOSIS — I77810 Thoracic aortic ectasia: Secondary | ICD-10-CM | POA: Diagnosis not present

## 2022-06-09 DIAGNOSIS — I352 Nonrheumatic aortic (valve) stenosis with insufficiency: Secondary | ICD-10-CM | POA: Diagnosis not present

## 2022-06-10 ENCOUNTER — Other Ambulatory Visit: Payer: Self-pay

## 2022-06-12 DIAGNOSIS — Z7901 Long term (current) use of anticoagulants: Secondary | ICD-10-CM | POA: Diagnosis not present

## 2022-06-13 ENCOUNTER — Other Ambulatory Visit: Payer: Self-pay | Admitting: *Deleted

## 2022-06-13 NOTE — Patient Outreach (Signed)
  Care Coordination [facility] Note Transition Care Management Unsuccessful Follow-up Telephone Call  Date of discharge and from where:  June 03 2022 from [facility] s/p AVR via thoracotomy Attempts:  1st Attempt  Reason for unsuccessful TCM follow-up call:  Left voice message  Joelene Millin L. Lavina Hamman, RN, BSN, [place] Coordinator Office number [phone] [facility] number [phone] Fax number [phone]

## 2022-06-17 DIAGNOSIS — Z7901 Long term (current) use of anticoagulants: Secondary | ICD-10-CM | POA: Diagnosis not present

## 2022-06-17 DIAGNOSIS — I350 Nonrheumatic aortic (valve) stenosis: Secondary | ICD-10-CM | POA: Diagnosis not present

## 2022-06-17 DIAGNOSIS — Z952 Presence of prosthetic heart valve: Secondary | ICD-10-CM | POA: Diagnosis not present

## 2022-06-17 DIAGNOSIS — Z4802 Encounter for removal of sutures: Secondary | ICD-10-CM | POA: Diagnosis not present

## 2022-06-17 DIAGNOSIS — Z09 Encounter for follow-up examination after completed treatment for conditions other than malignant neoplasm: Secondary | ICD-10-CM | POA: Diagnosis not present

## 2022-06-24 DIAGNOSIS — R9431 Abnormal electrocardiogram [ECG] [EKG]: Secondary | ICD-10-CM | POA: Diagnosis not present

## 2022-06-24 DIAGNOSIS — Z952 Presence of prosthetic heart valve: Secondary | ICD-10-CM | POA: Diagnosis not present

## 2022-06-24 DIAGNOSIS — Z7982 Long term (current) use of aspirin: Secondary | ICD-10-CM | POA: Diagnosis not present

## 2022-06-24 DIAGNOSIS — Z8679 Personal history of other diseases of the circulatory system: Secondary | ICD-10-CM | POA: Diagnosis not present

## 2022-06-24 DIAGNOSIS — Z79899 Other long term (current) drug therapy: Secondary | ICD-10-CM | POA: Diagnosis not present

## 2022-06-24 DIAGNOSIS — Z7901 Long term (current) use of anticoagulants: Secondary | ICD-10-CM | POA: Diagnosis not present

## 2022-06-24 DIAGNOSIS — Z48812 Encounter for surgical aftercare following surgery on the circulatory system: Secondary | ICD-10-CM | POA: Diagnosis not present

## 2022-06-24 DIAGNOSIS — J90 Pleural effusion, not elsewhere classified: Secondary | ICD-10-CM | POA: Diagnosis not present

## 2022-07-01 ENCOUNTER — Other Ambulatory Visit: Payer: Self-pay | Admitting: *Deleted

## 2022-07-01 ENCOUNTER — Encounter: Payer: Self-pay | Admitting: *Deleted

## 2022-07-02 ENCOUNTER — Ambulatory Visit: Payer: Self-pay | Admitting: *Deleted

## 2022-07-02 NOTE — Patient Outreach (Signed)
  Care Coordination [facility] Note Transition Care Management Unsuccessful Follow-up Telephone Call  Date of discharge and from where:  06/03/22 from [facility] [place] [facility]  Attempts:  2nd Attempt  Reason for unsuccessful TCM follow-up call:  Left voice message  Joelene Millin L. Lavina Hamman, RN, BSN, [place] Coordinator Office number [phone]

## 2022-07-08 ENCOUNTER — Ambulatory Visit: Payer: Self-pay | Admitting: *Deleted

## 2022-07-08 NOTE — Patient Outreach (Signed)
  Care Coordination   07/08/2022 Name: Colton Williams MRN: [id number] DOB: 1971-03-20   Care Coordination Outreach Attempts:  A third unsuccessful outreach was attempted today to offer the patient with information about available care coordination services as a benefit of their health plan.   Follow Up Plan:  No further outreach attempts will be made at this time. We have been unable to contact the patient to offer or enroll patient in care coordination services  Encounter Outcome:  No Answer  Care Coordination Interventions Activated:  No   Care Coordination Interventions:  No, not indicated    SIG Maday Guarino L. Lavina Hamman, RN, BSN, [place] Coordinator Office number [phone]

## 2022-07-09 DIAGNOSIS — Z7901 Long term (current) use of anticoagulants: Secondary | ICD-10-CM | POA: Diagnosis not present

## 2022-07-24 DIAGNOSIS — I5032 Chronic diastolic (congestive) heart failure: Secondary | ICD-10-CM | POA: Diagnosis not present

## 2022-07-24 DIAGNOSIS — I77810 Thoracic aortic ectasia: Secondary | ICD-10-CM | POA: Diagnosis not present

## 2022-07-24 DIAGNOSIS — Q231 Congenital insufficiency of aortic valve: Secondary | ICD-10-CM | POA: Diagnosis not present

## 2022-07-24 DIAGNOSIS — Z952 Presence of prosthetic heart valve: Secondary | ICD-10-CM | POA: Diagnosis not present

## 2022-07-24 DIAGNOSIS — I440 Atrioventricular block, first degree: Secondary | ICD-10-CM | POA: Diagnosis not present

## 2022-08-13 DIAGNOSIS — Z7901 Long term (current) use of anticoagulants: Secondary | ICD-10-CM | POA: Diagnosis not present

## 2022-09-04 DIAGNOSIS — Z7901 Long term (current) use of anticoagulants: Secondary | ICD-10-CM | POA: Diagnosis not present

## 2022-10-10 DIAGNOSIS — Z7901 Long term (current) use of anticoagulants: Secondary | ICD-10-CM | POA: Diagnosis not present

## 2022-10-24 DIAGNOSIS — Z0000 Encounter for general adult medical examination without abnormal findings: Secondary | ICD-10-CM | POA: Diagnosis not present

## 2022-10-24 DIAGNOSIS — N1831 Chronic kidney disease, stage 3a: Secondary | ICD-10-CM | POA: Diagnosis not present

## 2022-10-24 DIAGNOSIS — Z952 Presence of prosthetic heart valve: Secondary | ICD-10-CM | POA: Diagnosis not present

## 2022-10-24 DIAGNOSIS — E7800 Pure hypercholesterolemia, unspecified: Secondary | ICD-10-CM | POA: Diagnosis not present

## 2022-10-24 DIAGNOSIS — E790 Hyperuricemia without signs of inflammatory arthritis and tophaceous disease: Secondary | ICD-10-CM | POA: Diagnosis not present

## 2022-10-24 DIAGNOSIS — R7302 Impaired glucose tolerance (oral): Secondary | ICD-10-CM | POA: Diagnosis not present

## 2022-10-24 DIAGNOSIS — I10 Essential (primary) hypertension: Secondary | ICD-10-CM | POA: Diagnosis not present

## 2022-10-24 DIAGNOSIS — Z7901 Long term (current) use of anticoagulants: Secondary | ICD-10-CM | POA: Diagnosis not present

## 2022-10-24 DIAGNOSIS — I350 Nonrheumatic aortic (valve) stenosis: Secondary | ICD-10-CM | POA: Diagnosis not present

## 2022-11-08 DIAGNOSIS — Z7901 Long term (current) use of anticoagulants: Secondary | ICD-10-CM | POA: Diagnosis not present

## 2022-11-08 DIAGNOSIS — R7302 Impaired glucose tolerance (oral): Secondary | ICD-10-CM | POA: Diagnosis not present

## 2022-11-08 DIAGNOSIS — N1831 Chronic kidney disease, stage 3a: Secondary | ICD-10-CM | POA: Diagnosis not present

## 2022-11-08 DIAGNOSIS — E790 Hyperuricemia without signs of inflammatory arthritis and tophaceous disease: Secondary | ICD-10-CM | POA: Diagnosis not present

## 2022-11-08 DIAGNOSIS — E7800 Pure hypercholesterolemia, unspecified: Secondary | ICD-10-CM | POA: Diagnosis not present

## 2022-11-08 DIAGNOSIS — I10 Essential (primary) hypertension: Secondary | ICD-10-CM | POA: Diagnosis not present

## 2022-12-02 DIAGNOSIS — I77810 Thoracic aortic ectasia: Secondary | ICD-10-CM | POA: Diagnosis not present

## 2022-12-02 DIAGNOSIS — Z952 Presence of prosthetic heart valve: Secondary | ICD-10-CM | POA: Diagnosis not present

## 2022-12-02 DIAGNOSIS — Q231 Congenital insufficiency of aortic valve: Secondary | ICD-10-CM | POA: Diagnosis not present

## 2022-12-02 DIAGNOSIS — I10 Essential (primary) hypertension: Secondary | ICD-10-CM | POA: Diagnosis not present

## 2022-12-02 DIAGNOSIS — N1831 Chronic kidney disease, stage 3a: Secondary | ICD-10-CM | POA: Diagnosis not present

## 2022-12-02 DIAGNOSIS — E782 Mixed hyperlipidemia: Secondary | ICD-10-CM | POA: Diagnosis not present

## 2022-12-02 DIAGNOSIS — R7303 Prediabetes: Secondary | ICD-10-CM | POA: Diagnosis not present

## 2023-01-20 DIAGNOSIS — Z7901 Long term (current) use of anticoagulants: Secondary | ICD-10-CM | POA: Diagnosis not present

## 2023-04-10 DIAGNOSIS — Z7901 Long term (current) use of anticoagulants: Secondary | ICD-10-CM | POA: Diagnosis not present

## 2023-06-27 DIAGNOSIS — Z7901 Long term (current) use of anticoagulants: Secondary | ICD-10-CM | POA: Diagnosis not present

## 2023-09-02 DIAGNOSIS — Z7901 Long term (current) use of anticoagulants: Secondary | ICD-10-CM | POA: Diagnosis not present

## 2023-10-17 DIAGNOSIS — Z7901 Long term (current) use of anticoagulants: Secondary | ICD-10-CM | POA: Diagnosis not present

## 2023-11-06 DIAGNOSIS — Z7901 Long term (current) use of anticoagulants: Secondary | ICD-10-CM | POA: Diagnosis not present

## 2024-01-02 ENCOUNTER — Other Ambulatory Visit: Payer: Self-pay

## 2024-01-02 MED ORDER — OSELTAMIVIR PHOSPHATE 75 MG PO CAPS
75.0000 mg | ORAL_CAPSULE | Freq: Two times a day (BID) | ORAL | 0 refills | Status: AC
  Filled 2024-01-02: qty 10, 5d supply, fill #0
